# Patient Record
Sex: Female | Born: 1975 | Race: Black or African American | Hispanic: No | Marital: Single | Smoking: Never smoker
Health system: Southern US, Community
[De-identification: ages and names within clinical notes are randomized; demographics above are authoritative.]

## PROBLEM LIST (undated history)

## (undated) DIAGNOSIS — U071 COVID-19: Secondary | ICD-10-CM

## (undated) HISTORY — DX: COVID-19: U07.1

---

## 2000-09-10 ENCOUNTER — Other Ambulatory Visit: Admission: RE | Admit: 2000-09-10 | Discharge: 2000-09-10 | Payer: Self-pay | Admitting: Gynecology

## 2002-02-20 ENCOUNTER — Other Ambulatory Visit: Admission: RE | Admit: 2002-02-20 | Discharge: 2002-02-20 | Payer: Self-pay | Admitting: Gynecology

## 2004-06-06 ENCOUNTER — Other Ambulatory Visit: Admission: RE | Admit: 2004-06-06 | Discharge: 2004-06-06 | Payer: Self-pay | Admitting: Obstetrics and Gynecology

## 2005-03-19 ENCOUNTER — Emergency Department (HOSPITAL_COMMUNITY): Admission: EM | Admit: 2005-03-19 | Discharge: 2005-03-19 | Payer: Self-pay | Admitting: Emergency Medicine

## 2005-09-11 ENCOUNTER — Other Ambulatory Visit: Admission: RE | Admit: 2005-09-11 | Discharge: 2005-09-11 | Payer: Self-pay | Admitting: Obstetrics and Gynecology

## 2007-02-22 ENCOUNTER — Ambulatory Visit (HOSPITAL_COMMUNITY): Admission: RE | Admit: 2007-02-22 | Discharge: 2007-02-22 | Payer: Self-pay | Admitting: Obstetrics and Gynecology

## 2007-02-22 ENCOUNTER — Encounter (INDEPENDENT_AMBULATORY_CARE_PROVIDER_SITE_OTHER): Payer: Self-pay | Admitting: Specialist

## 2007-03-16 ENCOUNTER — Inpatient Hospital Stay (HOSPITAL_COMMUNITY): Admission: AD | Admit: 2007-03-16 | Discharge: 2007-03-16 | Payer: Self-pay | Admitting: Obstetrics and Gynecology

## 2008-03-23 ENCOUNTER — Ambulatory Visit (HOSPITAL_COMMUNITY): Admission: RE | Admit: 2008-03-23 | Discharge: 2008-03-23 | Payer: Self-pay | Admitting: Obstetrics and Gynecology

## 2008-08-13 ENCOUNTER — Inpatient Hospital Stay (HOSPITAL_COMMUNITY): Admission: AD | Admit: 2008-08-13 | Discharge: 2008-08-17 | Payer: Self-pay | Admitting: Obstetrics & Gynecology

## 2011-03-28 NOTE — Op Note (Signed)
Audrey Phillips, Audrey Phillips              ACCOUNT NO.:  [id number]   MEDICAL RECORD NO.:  [id number]          PATIENT TYPE:  INP   LOCATION:  9133                          FACILITY:  [facility]   PHYSICIAN:  Gerrit Friends. Aldona Bar, M.D.   DATE OF BIRTH:  04/20/1976   DATE OF PROCEDURE:  08/14/2008  DATE OF DISCHARGE:                               OPERATIVE REPORT   PREOPERATIVE DIAGNOSES:  1. Term pregnancy.  2. Spontaneous rupture of membranes.  3. Failed induction attempts.  4. Fetal intolerance to contractions.   POSTOPERATIVE DIAGNOSES:  1. Term pregnancy.  2. Spontaneous rupture of membranes.  3. Failed induction attempts.  4. Fetal intolerance to contractions.  5. Delivery of 7 pounds 7 ounces female infant, Apgars 8 and 9.   PROCEDURE:  Primary low-transverse cesarean section.   ANESTHESIA:  Spinal.   HISTORY:  This 35 year old gravida 2, para 0 was admitted on the  afternoon of August 13, 2008, after history consistent with spontaneous  rupture of membranes around 8:00 a.m. on the morning of August 13, 2008.  She was seen in triage and amniotomy was confirmed.  She was positive  for group B strep.  She was admitted, started on intravenous penicillin  and decision was made after discussing the overall situation with the  patient, as it had been done in the office on August 12, 2008, an  induction attempt, although it was very worrisome that the presentation  was unfavorable for a vaginal delivery and that her cervix was closed  and the presenting part, vertex, was essentially ballottable.  The  patient had a previous LEEP procedure on her cervix, which explained  some of the scarring felt on her cervix, but the vertex being  ballottable, it was felt to be possibly related to macrosomia.  Indeed  the patient had an ultrasound in the office on August 12, 2008, and  it revealed that estimated fetal weight was approximately 4000 g with a  normal amniotic fluid volume.   Cytotec was begun  in the early evening of August 13, 2008, and after  about 3 hours, the patient had several contractions and there was a  spontaneous deceleration lasting approximately 3 minutes with a heart  rate dipped into the 60s with good recovery and a reactive tracing  thereafter, but when I examined the patient shortly thereafter, her  cervix essentially was still unchanged and the presentation was  unchanged and it was my feeling that there was going to be in addition  to the possible macrosomia, a developing fetal intolerance to labor, and  decision was made to proceed with primary low-transverse cesarean  section.  The patient was very agreeable with this and was taken to the  operating room accordingly.   Once in the operating room, a spinal anesthetic was placed without  difficulty and thereafter the patient was prepped and draped and placed  in a supine position slightly tilted to the left.  Foley catheter had  been inserted prior to the patient's arrival in the operating room.   Once good anesthetic levels were documented, a Pfannenstiel incision was  made and with minimal difficulty dissected down sharply to and through  the fascia in a low-transverse fashion with hemostasis created at each  layer.  Subfascial space was created inferiorly and superiorly and  muscles separated in the midline.  Peritoneum was identified and  appropriate with care taken to avoid the bowel superiorly and the  bladder inferiorly.  At this time, sharp incision into the lower segment  of the uterus was carried out with Metzenbaum scissors extended  laterally.  There was a surprising amount of amniotic fluid noted at  this time - it was clear.  Thereafter, vertex position a viable female  infant was delivered.  Infant cried spontaneously once and after the  cord was clamped and cut, the infant was passed off to the awaiting team  and ultimately taken to the nursery in good condition.   Subsequent weight was found  to be 7 pounds and 7 ounces and Apgars were  8 and 9.  The patient was a cord blood donor and therefore the placenta  was delivered and passed off and cord bloods will be obtained by the  cord blood team.  At this time, the uterus was exteriorized, rendered  free of any remaining products of conception, and with good uterine  contractility effort and was slowly given intravenous Pitocin and manual  stimulation.  The uterine incision was closed first with a single layer  of #1 Vicryl in a running locking fashion and thereafter several figure-  of-eight #1 Vicryls for additional hemostasis.  At this time, the  incision was dry.  Tubes and ovaries appeared normal.  Abdomen was  lavaged of all free blood and clot.  Uterus replaced into the abdominal  cavity and after all counts were noted to be correct and no foreign  bodies were noted to be remaining in the abdominal cavity, closure of  the abdomen was carried out in layers..  The abdominal peritoneum was  closed with 0 Vicryl in a running fashion and muscle secured with same.  Assured of good fascial hemostasis, the fascia was then reapproximated  using 0 Vicryl from angle to midline bilaterally.  Subcutaneous tissue  was rendered hemostatic and thereafter staples were then used to close  the skin.  A sterile pressure dressing was applied, and the patient at  this time was transported to recovery room in satisfactory condition  having tolerated the procedure well.  Estimated blood loss 500 mL.  All  counts correct x2.   In summary, this patient ultimately required cesarean section because of  fetal intolerance to an attempted induction because of spontaneous  rupture of membranes at term.  There was an unfavorable presentation in  that vertex was ballottable - minus 4.  Her cervix was closed.  She  received one dose Cytotec and although the fetal heart rate was  reactive, had a spontaneous deceleration lasting approximately 3 minutes  with  fetal heart fallen down in the 60s and subsequently recovered again  with reactive tracing.  It was felt that because of the overall  presentation a somewhat probable suspicion for a large baby and now  spontaneous deceleration, very remote from the possibility of vaginal  delivery.  The cesarean section was the best option and accordingly she  was taken to the operating room where she was delivered of a 7 pounds 7  ounces female infant with good Apgar.  At conclusion of procedure, both  mother and baby were doing well in the respective recovery areas.  Gerrit Friends. Aldona Bar, M.D.  Electronically Signed     RMW/MEDQ  D:  08/14/2008  T:  08/14/2008  Job:  [id number]

## 2011-03-31 NOTE — Op Note (Signed)
Audrey Phillips, Audrey Phillips              ACCOUNT NO.:  [id number]   MEDICAL RECORD NO.:  [id number]          PATIENT TYPE:  AMB   LOCATION:  SDC                           FACILITY:  [facility]   PHYSICIAN:  Randye Lobo, M.D.   DATE OF BIRTH:  January 03, 1976   DATE OF PROCEDURE:  02/22/2007  DATE OF DISCHARGE:                               OPERATIVE REPORT   PREOPERATIVE DIAGNOSES:  1. Abnormal Pap smear.  2. 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  3. Unsatisfactory colposcopy.   POSTOPERATIVE DIAGNOSES:  1. Abnormal Pap smear.  2. 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  3. Unsatisfactory colposcopy.   PROCEDURE:  Colposcopy with LEEP conization of the cervix.   SURGEON:  Randye Lobo, M.D.   ANESTHESIA:  LMA, local with 1% lidocaine with 1:100,000 of epinephrine.   ESTIMATED BLOOD LOSS:  Minimal.   URINE OUTPUT:  150 mL.   COMPLICATIONS:  None.   INDICATIONS FOR PROCEDURE:  The patient is a 35 year old gravida 8  African American female with a Pap smear performed December 07, 2006,  which documented ASCUS with a positive high risk HPV status.  The  patient underwent colposcopy on January 17, 2007, which was  unsatisfactory.  No exocervical lesions were appreciated at that time  and an ECC was performed and documented koilocytic atypia of squamous  mucosa.  Endocervical mucosa was noted to be benign.  The colposcopy was  technically difficult due to the patient's anatomy.   A discussion was held with the patient regarding options for care and  due to the unsatisfactory colposcopy, a decision was made to proceed  with a LEEP procedure of the cervix and a possible cold knife cone  biopsy after risks, benefits, and alternatives were reviewed.   FINDINGS:  A colposcopy again was unsatisfactory intraoperatively.  There did appear to be some punctations and possible mosaics scattered  across the exocervix.  The cervix was noted to be nulliparous and the  patient had  long vaginal length.   SPECIMENS:  Three specimens were sent to pathology.  The first specimen  was the exocervical specimen.  The second specimen was the exocervical  specimen which was continued at the 3 o'clock position.  The third  cervical specimen was the endocervix.   PROCEDURE:  The patient was reidentified in the preoperative hold area.  She did receive Ancef 1 gram IV for antibiotic prophylaxis.  In the  operating room, an LMA anesthetic was induced.  The patient was placed  in the dorsal lithotomy position and the vagina and perineum were  sterilely prepped and draped.  The bladder was I&O cathed of urine.  An  exam under anesthesia was performed.  The cervix was palpably normal.  The uterus was noted to be small and no adnexal masses were appreciated.   An insulated speculum was placed inside the vagina and colposcopy was  performed after placing vinegar in the vagina.  The findings are as  noted above.  The cervix was then stained with Lugol's solution.  Local  anesthetic with 1% lidocaine and 1:100,000 epinephrine was  used to  circumferentially inject the cervix.  The LEEP procedure was then  performed with a cutting setting of 60 watts.  The exocervix was removed  in two separate specimens.  The square endocervical cowboy hat was  performed next and all of the specimens were sent separately.  Coagulation was used to coagulate the base of the LEEP conization  without coagulating over the endocervix.  Monsel's was placed over this.  Hemostasis was noted to be satisfactory at this time.  This concluded  the patient's procedure.  There were no complications.  All needle,  instrument and sponge counts were correct.      Randye Lobo, M.D.  Electronically Signed     BES/MEDQ  D:  02/22/2007  T:  02/22/2007  Job:  [id number]

## 2011-03-31 NOTE — Discharge Summary (Signed)
Audrey Phillips, Audrey Phillips              ACCOUNT NO.:  [id number]   MEDICAL RECORD NO.:  [id number]          PATIENT TYPE:  INP   LOCATION:  9133                          [facility]ILITY:  [facility]   PHYSICIAN:  Malva Limes, M.D.    DATE OF BIRTH:  12/18/1975   DATE OF ADMISSION:  08/13/2008  DATE OF DISCHARGE:  08/17/2008                               DISCHARGE SUMMARY   FINAL DIAGNOSES:  Intrauterine pregnancy at term, spontaneous rupture of  membranes.  Failed induction attempt and fetal intolerance to  contractions.   PROCEDURE:  Primary low transverse cesarean section.   SURGEON:  Gerrit Friends. Aldona Bar, M.D.   COMPLICATIONS:  None.   HOSPITAL COURSE:  This 35 year old G2, P 0-0-1-0 presents at term with  spontaneous rupture of membranes.  The patient is known to be positive  for group B strep, and was started on IV penicillin.  The patient's  antepartum course up to this point had been complicated by history of a  LEEP procedure and her known group B strep status.  Otherwise,  antepartum course was without complications.  During the patient's  labor, she was started on Pitocin.  The patient's cervix was still  closed and unfavorable, but she had also had a history of a LEEP  procedure, and some of this was thought to be from scarring on her  cervix, so Cytotec was begun on the morning of August 13, 2008.  There  were some spontaneous decelerations lasting about 3 minutes, with good  recovery, but after the patient was examined and her cervix was still  unchanged, a decision was made to proceed with a cesarean section  secondary to being remote from delivery with fetal intolerance to labor.  At this point, the patient was taken to the operating room on August 14, 2008, by Dr. Annamaria Helling, where a primary low transverse cesarean  section was performed, with the delivery of 7-pound 7-ounce female infant  with Apgars of 8 and 9.  The delivery went without complications.  The  patient's postoperative  course was benign without any significant  fevers.  She did want her little boy circumcised prior to discharge, and  she was felt ready for discharge on postoperative day #3.  She was sent  home on a regular diet, told to decrease activities, told to continue  her prenatal vitamins daily, was given Percocet 1-2 every 4-6 hours as  needed for pain, told she could use ibuprofen up to 600 mg every 6 hours  as needed for pain, and was to follow up in our office in 4 weeks.  Instructions and precautions were reviewed with the patient.   LABORATORIES ON DISCHARGE:  The patient had a hemoglobin of 12.1, white  blood cell count of 14.2, and platelets of 286,000.     Leilani Able, P.A.-C.      ______________________________  Malva Limes, M.D.   MB/MEDQ  D:  09/15/2008  T:  09/16/2008  Job:  [id number]

## 2011-05-04 ENCOUNTER — Other Ambulatory Visit: Payer: Self-pay | Admitting: Obstetrics and Gynecology

## 2011-08-14 LAB — CBC
HCT: 35.7 — ABNORMAL LOW
MCHC: 33.1
Platelets: 286
Platelets: 386
RBC: 4.63
WBC: 13.8 — ABNORMAL HIGH
WBC: 14.2 — ABNORMAL HIGH

## 2011-08-14 LAB — RPR: RPR Ser Ql: NONREACTIVE

## 2012-03-28 ENCOUNTER — Telehealth: Payer: Self-pay | Admitting: Oncology

## 2012-03-28 NOTE — Telephone Encounter (Signed)
Pt called back to confirm her appt d/t for 04/10/2012

## 2012-03-28 NOTE — Telephone Encounter (Signed)
lmonvm advising the pt of her new pt appt with dr Clelia Croft on 04/10/2012 and to call me back to confirm

## 2012-03-28 NOTE — Telephone Encounter (Signed)
Del.03/28/12 Ref.Dr.Candace Smith Dx.Elevated WBC

## 2012-04-05 ENCOUNTER — Other Ambulatory Visit: Payer: Self-pay | Admitting: Oncology

## 2012-04-05 DIAGNOSIS — D72829 Elevated white blood cell count, unspecified: Secondary | ICD-10-CM

## 2012-04-10 ENCOUNTER — Ambulatory Visit: Payer: Self-pay

## 2012-04-10 ENCOUNTER — Ambulatory Visit (HOSPITAL_BASED_OUTPATIENT_CLINIC_OR_DEPARTMENT_OTHER): Payer: 59 | Admitting: Oncology

## 2012-04-10 ENCOUNTER — Encounter: Payer: Self-pay | Admitting: Oncology

## 2012-04-10 ENCOUNTER — Telehealth: Payer: Self-pay | Admitting: Oncology

## 2012-04-10 ENCOUNTER — Other Ambulatory Visit (HOSPITAL_BASED_OUTPATIENT_CLINIC_OR_DEPARTMENT_OTHER): Payer: 59 | Admitting: Lab

## 2012-04-10 VITALS — BP 128/85 | HR 66 | Temp 98.7°F | Ht 60.5 in | Wt 235.9 lb

## 2012-04-10 DIAGNOSIS — D72829 Elevated white blood cell count, unspecified: Secondary | ICD-10-CM

## 2012-04-10 LAB — COMPREHENSIVE METABOLIC PANEL
Albumin: 4.2 g/dL (ref 3.5–5.2)
BUN: 10 mg/dL (ref 6–23)
CO2: 25 mEq/L (ref 19–32)
Calcium: 8.9 mg/dL (ref 8.4–10.5)
Chloride: 105 mEq/L (ref 96–112)
Glucose, Bld: 102 mg/dL — ABNORMAL HIGH (ref 70–99)
Potassium: 4.3 mEq/L (ref 3.5–5.3)
Sodium: 138 mEq/L (ref 135–145)
Total Protein: 6.7 g/dL (ref 6.0–8.3)

## 2012-04-10 LAB — CBC WITH DIFFERENTIAL/PLATELET
Basophils Absolute: 0.1 10*3/uL (ref 0.0–0.1)
Eosinophils Absolute: 0.4 10*3/uL (ref 0.0–0.5)
HCT: 40.6 % (ref 34.8–46.6)
HGB: 13.1 g/dL (ref 11.6–15.9)
MCH: 27.2 pg (ref 25.1–34.0)
MONO#: 1 10*3/uL — ABNORMAL HIGH (ref 0.1–0.9)
NEUT#: 8.8 10*3/uL — ABNORMAL HIGH (ref 1.5–6.5)
NEUT%: 66.2 % (ref 38.4–76.8)
lymph#: 3.1 10*3/uL (ref 0.9–3.3)

## 2012-04-10 NOTE — Progress Notes (Signed)
New patient today, patient has insurance, patient did not need financial assistance at this time, gave patient contact information.

## 2012-04-10 NOTE — Progress Notes (Signed)
Note dictated

## 2012-04-10 NOTE — Telephone Encounter (Signed)
gv pt appt schedule for nov.  °

## 2012-04-10 NOTE — Progress Notes (Signed)
CC:   Dario Guardian, M.D.  REASON FOR CONSULTATION:  Leukocytosis.  HISTORY OF PRESENT ILLNESS:  This is a pleasant 36 year old [place] American woman, native of [place], lived the majority of her life around this area.  She is a very healthy 36 year old with a history of mild obesity but otherwise has not really had any major issues.  She has had a history of seasonal allergies as well as recurrent sinus problems in the past, but for the most part, has not really had any hospitalization or chronic illnesses.  She was evaluated recently by her primary care physician in April of 2013 and her CBC at that time showed her white cell count was 12.7, hemoglobin was 13, platelet count was around 340.  Previous CBCs were reviewed and she had a normal CBC in March of 2012 of 10.5, normal differential at that time.  She had a white cell count 13,000 in February of 2011.  In 2009 she had had an elevated white cell count as well of 14,000 and 13,000 respectively in October of 2009.  For that reason, the patient was referred to me for evaluation.  Clinically she feels well.  She has not had any recent illnesses.  She has not had any recent hospitalizations.  She has had some recurrent sinus problems and drainage and seasonal allergies but has not had any sinusitis, has not any bronchitis or pneumonia.  She has not had any skin infections or hospitalization.  She is under psychological stress being a single mother, working, but for the most part, really no illnesses or hospitalizations.  REVIEW OF SYSTEMS:  She did not report any headaches, blurry vision, double vision.  She did not report any motor or sensory neuropathy.  She did not report any alteration in mental status.  She did not report any psychiatric issues, depression.  She did not report any fever, chills, sweats.  She did not report any cough, hemoptysis, hematemesis.  No nausea or vomiting.  No abdominal pain, hematochezia,  melena, genitourinary complaints.  Rest of review of system is unremarkable.  PAST MEDICAL HISTORY:  Really unremarkable for any history of hypertension, diabetes, coronary artery disease.  Status post LEEP procedure and C-section 2008 and 2009.  She has had recurrent sinus problems, as mentioned, allergies and bronchitis as a child.  FAMILY HISTORY:  Father alive.  She does not really know much about his history.  Mother died of cirrhosis of the liver.  Her paternal grandmother had a brain tumor.  Paternal grandmother had cancer of the breast.  Sister has high blood pressure.  SOCIAL HISTORY:  She is single, has 1 child.  Denied any alcohol or tobacco abuse.  She works in Clinical biochemist.  ALLERGIES:  Fish oil.  PHYSICAL EXAM:  General:  Alert, awake female, appeared in no active distress.  Vital signs:  Blood pressure 128/85, pulse 66, respiration 20, temperature is 98.  Weight is 235 pounds.  HEENT:  Head is normocephalic, atraumatic.  Pupils equal, round, reactive to light. Oral mucosa moist and pink.  Neck:  Supple without lymphadenopathy. Heart:  Regular rate and rhythm.  S1, S2.  Lungs:  Clear auscultation without rhonchi, wheeze, dullness to percussion.  Abdomen:  Soft, nontender.  No hepatosplenomegaly.  Extremities:  No clubbing, cyanosis, or edema.  Neurological:  Intact motor, sensory and deep tendon reflexes.  LABORATORY DATA:  Showed a hemoglobin of 13, white cell count was 13,000, platelet count 338, neutrophil percentage 66.2, lymphocyte percentage 23.0, all  normal.  Peripheral smear was personally reviewed today.  Could not really appreciate any evidence of any schistocytosis or red cell fragmentation.  I could not really appreciate any white cell abnormalities.  There is no evidence of any leukoblasts or an abnormal or poorly differentiated cells or immature cells.  ASSESSMENT AND PLAN:  This is a pleasant 36 year old woman with the following  issues:  Leukocytosis:  Differential diagnosis discussed today with Ms. Audrey Phillips. I feel that this is most likely a reactive process at this point. Conditions like allergy, infection, stress all cause mild leukocytosis. It seems to be a chronic problem, fluctuating problem for the last 3-4 years.  I doubt this is a sign of a myeloproliferative disorder. Conditions such as chronic myelogenous leukemia and other myeloproliferative disorders, I think, are unlikely at this point.  The peripheral smear does not suggest that.  Her history does not suggest at this time.  In terms of moving forward and for management, I would probably recommend observation.  I would like to repeat her blood counts in about 6 months and look at her smear at this time.  I will defer further workup including BCR/ABL, JAK2 mutation or bone marrow biopsy if there is a persistent neutrophilia at that time.  All her questions were answered today.    ______________________________ Benjiman Core, M.D. FNS/MEDQ  D:  04/10/2012  T:  04/10/2012  Job:  [id number]

## 2012-07-31 ENCOUNTER — Telehealth: Payer: Self-pay | Admitting: Oncology

## 2012-07-31 NOTE — Telephone Encounter (Signed)
LVM for pt regarding appt time change, mailed appt change form.   sed

## 2012-10-11 ENCOUNTER — Ambulatory Visit: Payer: 59 | Admitting: Oncology

## 2012-10-11 ENCOUNTER — Other Ambulatory Visit: Payer: 59 | Admitting: Lab

## 2016-02-09 ENCOUNTER — Other Ambulatory Visit: Payer: Self-pay | Admitting: Radiology

## 2018-01-22 ENCOUNTER — Other Ambulatory Visit (HOSPITAL_COMMUNITY)
Admission: RE | Admit: 2018-01-22 | Discharge: 2018-01-22 | Disposition: A | Discharge status: Home / Self Care | Payer: Medicaid Other | Source: Ambulatory Visit | Attending: Physician Assistant | Admitting: Physician Assistant

## 2018-01-22 ENCOUNTER — Other Ambulatory Visit: Payer: Self-pay | Admitting: Physician Assistant

## 2018-01-22 DIAGNOSIS — Z0000 Encounter for general adult medical examination without abnormal findings: Secondary | ICD-10-CM | POA: Insufficient documentation

## 2018-01-25 LAB — CYTOLOGY - PAP
ADEQUACY: ABSENT — AB
Diagnosis: UNDETERMINED — AB
HPV: DETECTED — AB

## 2018-02-13 ENCOUNTER — Other Ambulatory Visit: Payer: Self-pay | Admitting: Obstetrics and Gynecology

## 2019-02-11 ENCOUNTER — Emergency Department (HOSPITAL_COMMUNITY): Payer: 59

## 2019-02-11 ENCOUNTER — Emergency Department (HOSPITAL_COMMUNITY)
Admission: AD | Admit: 2019-02-11 | Discharge: 2019-02-11 | Disposition: A | Discharge status: Home / Self Care | Payer: 59 | Attending: Emergency Medicine | Admitting: Emergency Medicine

## 2019-02-11 ENCOUNTER — Telehealth: Payer: Self-pay | Admitting: *Deleted

## 2019-02-11 ENCOUNTER — Other Ambulatory Visit: Payer: Self-pay

## 2019-02-11 DIAGNOSIS — R102 Pelvic and perineal pain: Secondary | ICD-10-CM | POA: Diagnosis present

## 2019-02-11 DIAGNOSIS — Z79899 Other long term (current) drug therapy: Secondary | ICD-10-CM | POA: Insufficient documentation

## 2019-02-11 DIAGNOSIS — N926 Irregular menstruation, unspecified: Secondary | ICD-10-CM | POA: Insufficient documentation

## 2019-02-11 DIAGNOSIS — N946 Dysmenorrhea, unspecified: Secondary | ICD-10-CM

## 2019-02-11 DIAGNOSIS — N857 Hematometra: Secondary | ICD-10-CM

## 2019-02-11 LAB — URINALYSIS, ROUTINE W REFLEX MICROSCOPIC
Bilirubin Urine: NEGATIVE
Glucose, UA: NEGATIVE mg/dL
Hgb urine dipstick: NEGATIVE
Ketones, ur: NEGATIVE mg/dL
Leukocytes,Ua: NEGATIVE
Nitrite: NEGATIVE
Protein, ur: NEGATIVE mg/dL
Specific Gravity, Urine: 1.02 (ref 1.005–1.030)
pH: 7 (ref 5.0–8.0)

## 2019-02-11 LAB — WET PREP, GENITAL
Clue Cells Wet Prep HPF POC: NONE SEEN
Sperm: NONE SEEN
Trich, Wet Prep: NONE SEEN
WBC, Wet Prep HPF POC: NONE SEEN
Yeast Wet Prep HPF POC: NONE SEEN

## 2019-02-11 LAB — COMPREHENSIVE METABOLIC PANEL WITH GFR
ALT: 13 U/L (ref 0–44)
AST: 24 U/L (ref 15–41)
Albumin: 3.9 g/dL (ref 3.5–5.0)
Alkaline Phosphatase: 70 U/L (ref 38–126)
Anion gap: 12 (ref 5–15)
BUN: 10 mg/dL (ref 6–20)
CO2: 18 mmol/L — ABNORMAL LOW (ref 22–32)
Calcium: 8.8 mg/dL — ABNORMAL LOW (ref 8.9–10.3)
Chloride: 105 mmol/L (ref 98–111)
Creatinine, Ser: 0.79 mg/dL (ref 0.44–1.00)
GFR calc Af Amer: >60 mL/min
GFR calc non Af Amer: >60 mL/min
Glucose, Bld: 172 mg/dL — ABNORMAL HIGH (ref 70–99)
Potassium: 3.5 mmol/L (ref 3.5–5.1)
Sodium: 135 mmol/L (ref 135–145)
Total Bilirubin: 0.5 mg/dL (ref 0.3–1.2)
Total Protein: 7.6 g/dL (ref 6.5–8.1)

## 2019-02-11 LAB — CBC
HCT: 41.3 % (ref 36.0–46.0)
Hemoglobin: 13.3 g/dL (ref 12.0–15.0)
MCH: 27.2 pg (ref 26.0–34.0)
MCHC: 32.2 g/dL (ref 30.0–36.0)
MCV: 84.5 fL (ref 80.0–100.0)
PLATELETS: 347 10*3/uL (ref 150–400)
RBC: 4.89 MIL/uL (ref 3.87–5.11)
RDW: 13.6 % (ref 11.5–15.5)
WBC: 16.9 10*3/uL — ABNORMAL HIGH (ref 4.0–10.5)
nRBC: 0 % (ref 0.0–0.2)

## 2019-02-11 LAB — GC/CHLAMYDIA PROBE AMP (~~LOC~~) NOT AT ARMC
Chlamydia: NEGATIVE
Neisseria Gonorrhea: NEGATIVE

## 2019-02-11 LAB — POCT PREGNANCY, URINE: Preg Test, Ur: NEGATIVE

## 2019-02-11 LAB — LIPASE, BLOOD: Lipase: 19 U/L (ref 11–51)

## 2019-02-11 MED ORDER — MORPHINE SULFATE (PF) 4 MG/ML IV SOLN
4.0000 mg | Freq: Once | INTRAVENOUS | Status: AC
  Administered 2019-02-11: 4 mg via INTRAVENOUS
  Filled 2019-02-11: qty 1

## 2019-02-11 MED ORDER — ONDANSETRON HCL 4 MG/2ML IJ SOLN
4.0000 mg | Freq: Once | INTRAMUSCULAR | Status: AC | PRN
  Administered 2019-02-11: 4 mg via INTRAVENOUS
  Filled 2019-02-11: qty 2

## 2019-02-11 MED ORDER — ONDANSETRON 4 MG PO TBDP: 4.0000 mg | ORAL_TABLET | Freq: Four times a day (QID) | ORAL | 0 refills | Status: AC | PRN

## 2019-02-11 MED ORDER — IBUPROFEN 800 MG PO TABS: 800.0000 mg | ORAL_TABLET | Freq: Three times a day (TID) | ORAL | 0 refills | Status: AC | PRN

## 2019-02-11 MED ORDER — KETOROLAC TROMETHAMINE 30 MG/ML IJ SOLN
30.0000 mg | Freq: Once | INTRAMUSCULAR | Status: AC
  Administered 2019-02-11: 30 mg via INTRAVENOUS
  Filled 2019-02-11: qty 1

## 2019-02-11 MED ORDER — ONDANSETRON HCL 4 MG/2ML IJ SOLN
4.0000 mg | Freq: Once | INTRAMUSCULAR | Status: DC
Start: 2019-02-11 — End: 2019-02-11

## 2019-02-11 MED ORDER — ONDANSETRON HCL 4 MG/2ML IJ SOLN
INTRAMUSCULAR | Status: AC
  Filled 2019-02-11: qty 2

## 2019-02-11 MED ORDER — SODIUM CHLORIDE 0.9% FLUSH
3.0000 mL | Freq: Once | INTRAVENOUS | Status: AC
  Administered 2019-02-11: 3 mL via INTRAVENOUS

## 2019-02-11 MED ORDER — OXYCODONE-ACETAMINOPHEN 5-325 MG PO TABS: 2.0000 | ORAL_TABLET | Freq: Four times a day (QID) | ORAL | 0 refills | Status: AC | PRN

## 2019-02-11 NOTE — Discharge Instructions (Signed)
Please call your OB/GYN this morning for close outpatient follow-up.  Your ultrasound showed a fluid collection in the lower part of your uterus called a hematometra that will need to be addressed soon.  We have talked to the on-call OB/GYN.  Given your pain is well controlled, we feel it is reasonable to treat you as an outpatient.  If you have increasing pain, vomiting that will not stop, fever, begin bleeding so heavily that you are soaking through more than a pad an hour for more than 2 straight hours, feel lightheaded or pass out, please return to the emergency department immediately.

## 2019-02-11 NOTE — Telephone Encounter (Signed)
Pt called stating she gave incorrect pharmacy to have Rx e-scribed to.  EDCM reviewed chart to find pt has narcotic prescribed.  [facility] informed pt that we can not call this Rx in.  PT stated pharmacy it went to is just a little out of the way, but she will go there.  No further EDCM needs identified.

## 2019-02-11 NOTE — MAU Note (Addendum)
Pt here for period cramping and pain that started this morning and vomiting. States she cannot keep anything down. Pt states this has been an ongoing issue x1 year. Last took 400mg  of ibuprofen around 8-9pm, but vomited it up. Reports that she is not pregnant. LMP: 02/07/2019.

## 2019-02-11 NOTE — ED Triage Notes (Signed)
Pt to ED for pelvic pain with N&V.  10 of 10 cramping pain.

## 2019-02-11 NOTE — ED Provider Notes (Signed)
TIME SEEN: 2:57 AM  CHIEF COMPLAINT: Pelvic pain, vomiting  HPI: Patient is a 43 year old female with history of dysmenorrhea who presents to the emergency department with complaints of pelvic pain for the past couple of days and nausea and vomiting.  Also has had some diarrhea.  Reports she has had similar episodes intermittently for the past year and was put on birth control by her OB/GYN, Gerald Leitz.  States that she has had mucus-like vaginal discharge and some dysuria.  No fevers, chest pain, shortness of breath, cough.  No previous abdominal surgery.  Has had history of sexually transmitted disease as a teenager.  Has had 2 pregnancies with 1 living child and one on elective abortion.   ROS: See HPI Constitutional: no fever  Eyes: no drainage  ENT: no runny nose   Cardiovascular:  no chest pain  Resp: no SOB  GI: Vomiting and diarrhea GU: no dysuria Integumentary: no rash  Allergy: no hives  Musculoskeletal: no leg swelling  Neurological: no slurred speech ROS otherwise negative  PAST MEDICAL HISTORY/PAST SURGICAL HISTORY:  No past medical history on file.  MEDICATIONS:  Prior to Admission medications   Medication Sig Start Date End Date Taking? Authorizing Provider  Multiple Vitamin (MULTIVITAMIN) tablet Take 1 tablet by mouth daily.    [provider]    ALLERGIES:  No Known Allergies  SOCIAL HISTORY:  Social History   Tobacco Use  . Smoking status: Not on file  Substance Use Topics  . Alcohol use: Not on file    FAMILY HISTORY: No family history on file.  EXAM: BP (!) 145/94 (BP Location: Right Arm)   Pulse 67   Temp 97.7 F (36.5 C) (Oral)   Resp 20   Ht 5\' 1"  (1.549 m)   Wt 107.8 kg   LMP 02/07/2019   SpO2 99%   BMI 44.91 kg/m  CONSTITUTIONAL: Alert and oriented and responds appropriately to questions.  Obese, appears very uncomfortable, actively vomiting HEAD: Normocephalic EYES: Conjunctivae clear, pupils appear equal, EOMI ENT: normal  nose; moist mucous membranes NECK: Supple, no meningismus, no nuchal rigidity, no LAD  CARD: RRR; S1 and S2 appreciated; no murmurs, no clicks, no rubs, no gallops RESP: Normal chest excursion without splinting or tachypnea; breath sounds clear and equal bilaterally; no wheezes, no rhonchi, no rales, no hypoxia or respiratory distress, speaking full sentences ABD/GI: Normal bowel sounds; non-distended; soft, dry throughout the pelvic region but more so on the left side, no significant tenderness at McBurney's point, no rebound, no guarding, no peritoneal signs, no hepatosplenomegaly GU:  Normal external genitalia. No lesions, rashes noted.  Patient has a small amount of dark red/brown blood coming from the cervical loss with mucus-like discharge.  She has left adnexal tenderness without mass or fullness.  No significant right adnexal tenderness.  No cervical motion tenderness.  Cervix is not appear friable.  Cervix is closed.  Chaperone present for exam.  BACK:  The back appears normal and is non-tender to palpation, there is no CVA tenderness EXT: Normal ROM in all joints; non-tender to palpation; no edema; normal capillary refill; no cyanosis, no calf tenderness or swelling    SKIN: Normal color for age and race; warm; no rash NEURO: Moves all extremities equally PSYCH: The patient's mood and manner are appropriate. Grooming and personal hygiene are appropriate.  MEDICAL DECISION MAKING: Patient here with pelvic pain and vomiting.  Has had symptoms of menometrorrhagia before.  States that she was started on birth control  and had not had a period since December.  She denies being sexually active.  Pelvic cultures have been sent.  She initially presented to [facility] and had a negative urinalysis and negative pregnancy test and was sent here for further evaluation.  Differential includes endometriosis, fibroids, ovarian torsion, ovarian cyst, TOA or less likely PID.  Doubt appendicitis.  Will obtain  transvaginal ultrasound with Doppler.  Will obtain labs.  Will give pain and nausea medicine.  ED PROGRESS: Labs show leukocytosis of 16,000 which appears to be near patient's baseline.  Hemoglobin 13 which is stable.  Otherwise labs unremarkable.  Wet prep unremarkable as well.   6:00 AM  D/w Dr. Su Hilt on call with patient's OB/GYN.  Patient's transvaginal ultrasound did not visualize either ovary but did show complex fluid collection in the lower uterine segment concerning for a hematometra.  She has not had any recent ablation or procedure.  Has previously had a LEEP.  Did have a biopsy 1 year ago.  Dr. Su Hilt recommends close follow-up with her OB/GYN as she will likely need her cervix dilated to help remove this fluid collection and recommend she call in the morning.  She will also get in touch with patient's OB/GYN.  Patient's pain is been well controlled here and she feels comfortable with the plan for discharge home.  We had lengthy discussion of return precautions.    At this time, I do not feel there is any life-threatening condition present. I have reviewed and discussed all results (EKG, imaging, lab, urine as appropriate) and exam findings with patient/family. I have reviewed nursing notes and appropriate previous records.  I feel the patient is safe to be discharged home without further emergent workup and can continue workup as an outpatient as needed. Discussed usual and customary return precautions. Patient/family verbalize understanding and are comfortable with this plan.  Outpatient follow-up has been provided as needed. All questions have been answered.    Skyler Carel, Layla Maw, DO 02/11/19 [phone]

## 2019-02-11 NOTE — [facility]U Provider Note (Signed)
  S Ms. Audrey Phillips is a 43 y.o.  non-pregnant female who presents to [facility]U today with complaint of cramping.  She states her pain started around 9pm and has been ongoing.    O BP (!) 145/94 (BP Location: Right Arm)   Pulse 67   Temp 97.7 F (36.5 C) (Oral)   Resp 20   Ht 5\' 1"  (1.549 m)   Wt 107.8 kg   LMP 02/07/2019   SpO2 99%   BMI 44.91 kg/m  Physical Exam  Constitutional: She is oriented to person, place, and time. She appears well-developed and well-nourished.  HENT:  Head: Normocephalic and atraumatic.  Eyes: Conjunctivae are normal.  Neck: Normal range of motion.  Cardiovascular: Normal rate.  Respiratory: Effort normal.  Neurological: She is alert and oriented to person, place, and time.  Psychiatric: She has a normal mood and affect. Her behavior is normal.    A Non pregnant female Medical screening exam complete   P Discharge from [facility]U in stable condition Patient given the option of transfer to [facility] for further evaluation or seek care in outpatient facility of choice Patient opts for transfer to [facility] Report called to [facility] by charge RN  Gerrit Heck, CNM 02/11/2019

## 2019-02-12 ENCOUNTER — Other Ambulatory Visit: Payer: Self-pay | Admitting: Obstetrics and Gynecology

## 2019-02-12 ENCOUNTER — Other Ambulatory Visit (HOSPITAL_COMMUNITY)
Admission: RE | Admit: 2019-02-12 | Discharge: 2019-02-12 | Disposition: A | Discharge status: Home / Self Care | Payer: 59 | Source: Ambulatory Visit | Attending: Obstetrics and Gynecology | Admitting: Obstetrics and Gynecology

## 2019-02-12 DIAGNOSIS — R87610 Atypical squamous cells of undetermined significance on cytologic smear of cervix (ASC-US): Secondary | ICD-10-CM | POA: Diagnosis present

## 2019-02-17 LAB — CYTOLOGY - PAP: HPV: DETECTED — AB

## 2019-07-03 ENCOUNTER — Other Ambulatory Visit: Payer: Self-pay | Admitting: Obstetrics and Gynecology

## 2019-11-17 IMAGING — US US PELVIS LIMITED
1 series · 14 of 25 positions shown · non-contrast
Comparison: None similar

CLINICAL DATA: Left adnexal pain



[Series 1: us pelvis limited · 43 acquisitions, 14 frames shown]
[im 1/43]
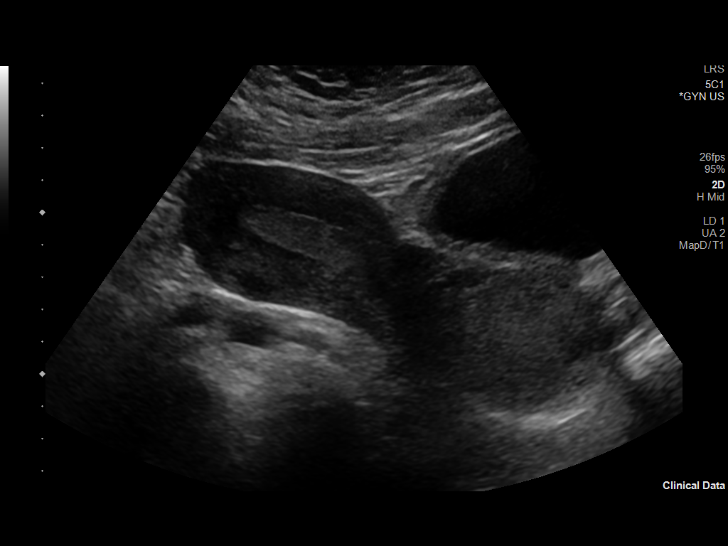
[im 4/43]
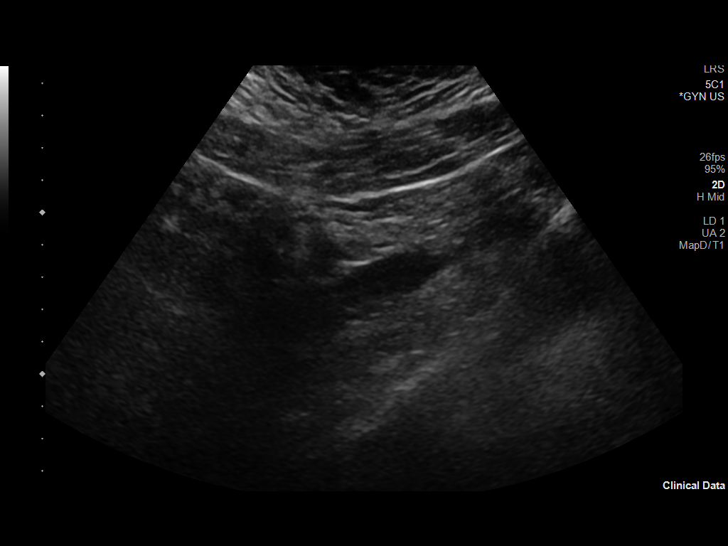
[im 8/43]
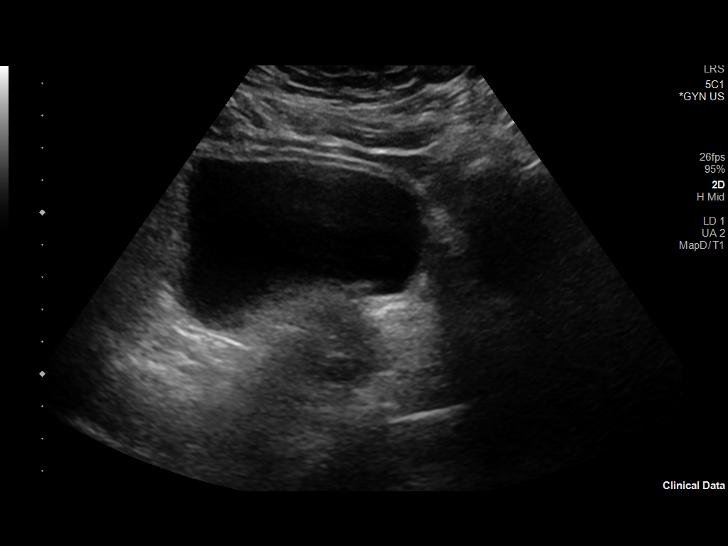
[im 11/43]
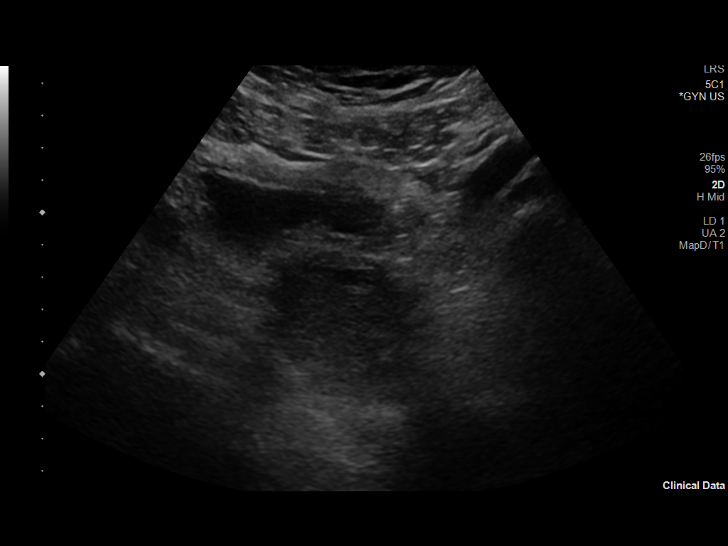
[im 15/43]
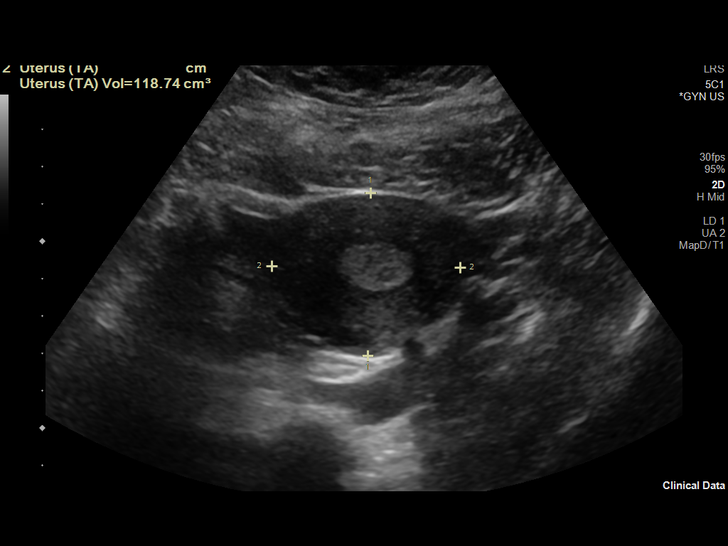
[im 16/43]
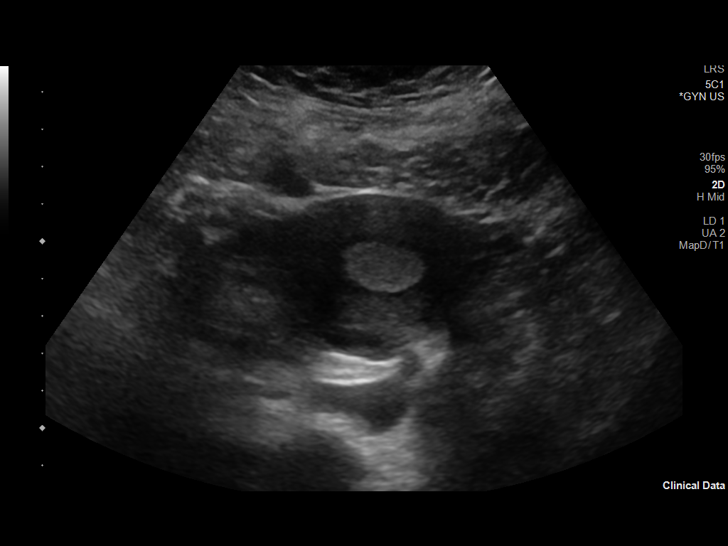
[im 20/43]
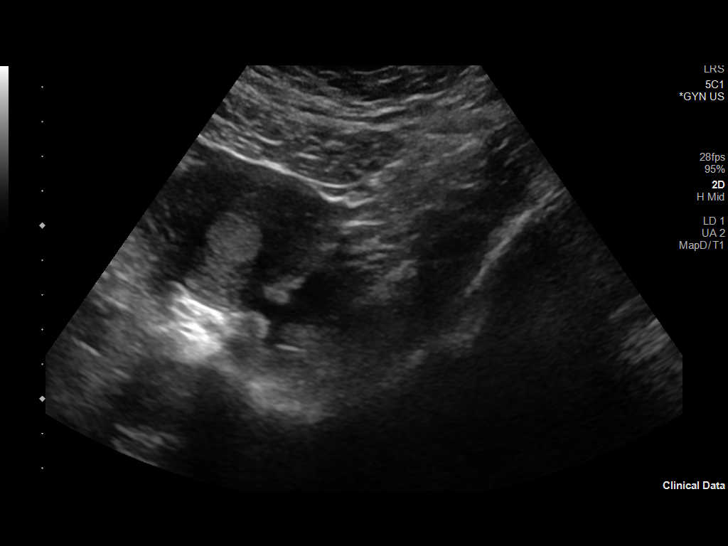
[im 23/43]
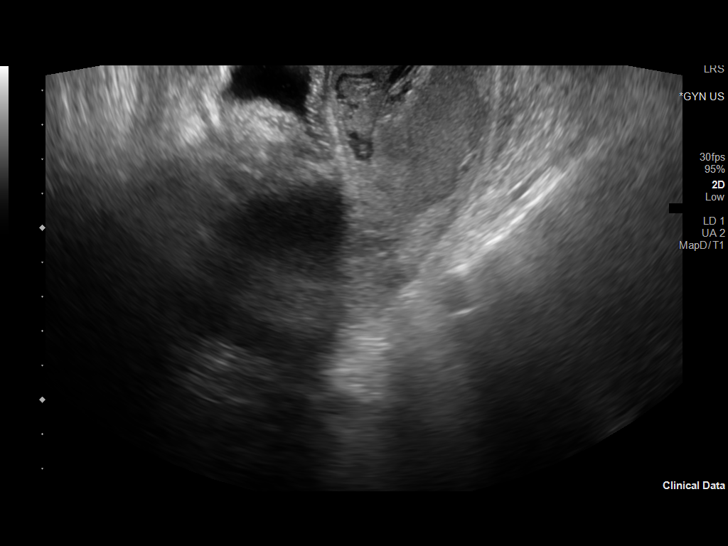
[im 27/43]
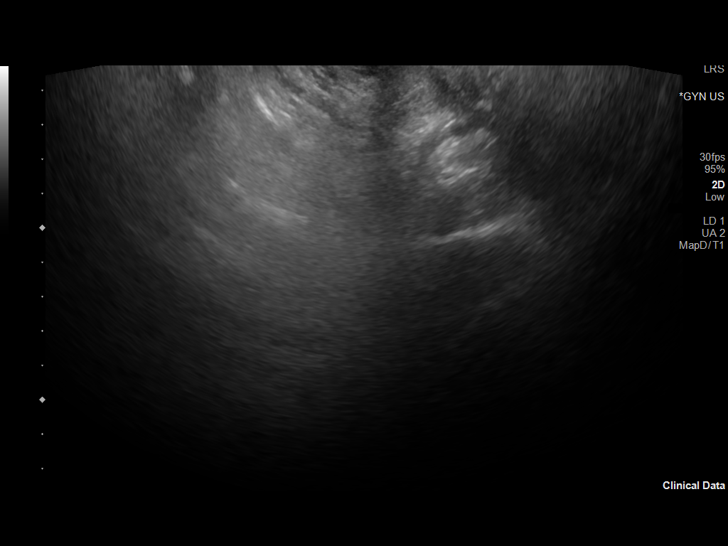
[im 29/43]
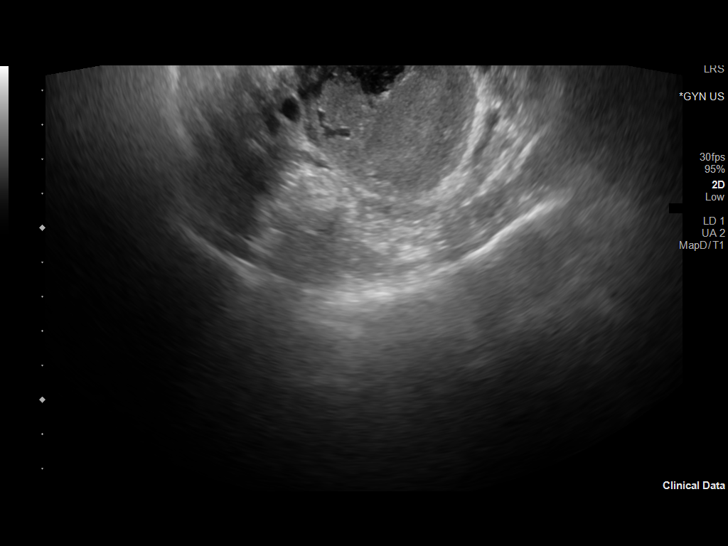
[im 32/43]
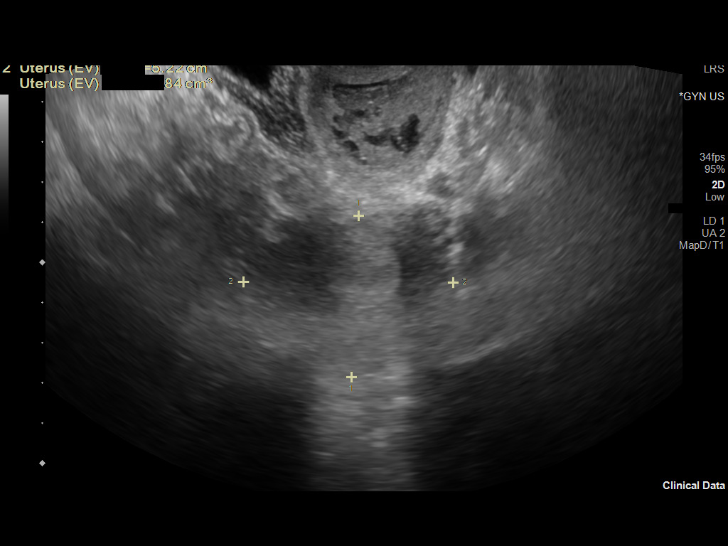
[im 36/43]
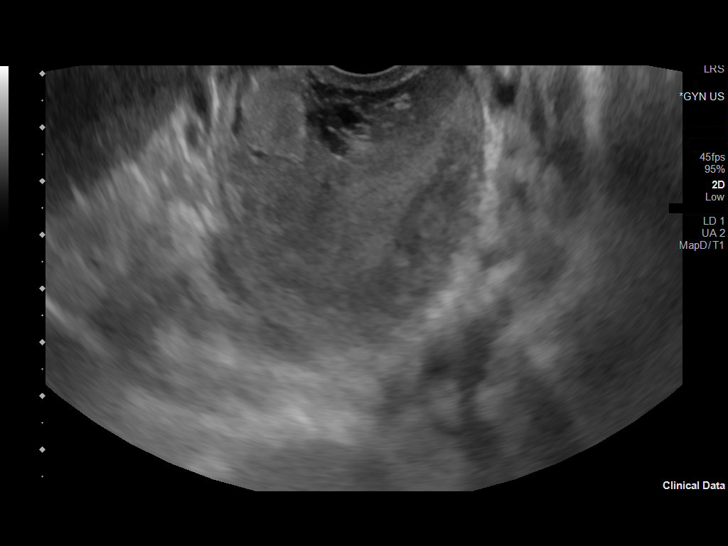
[im 39/43]
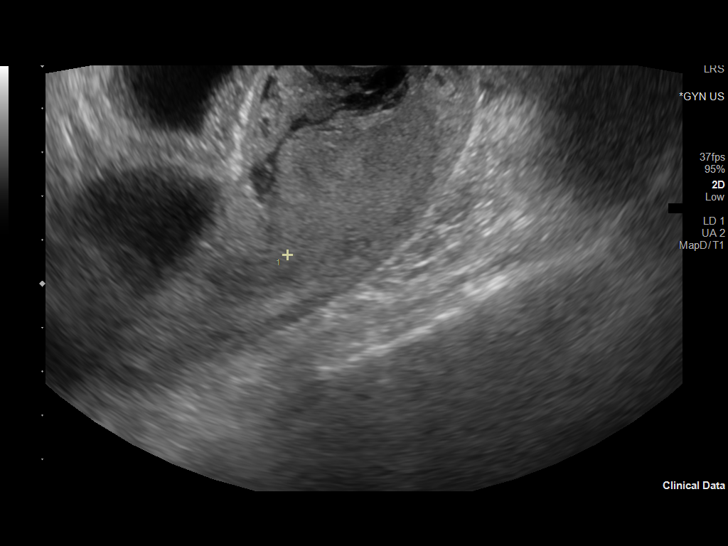
[im 43/43]
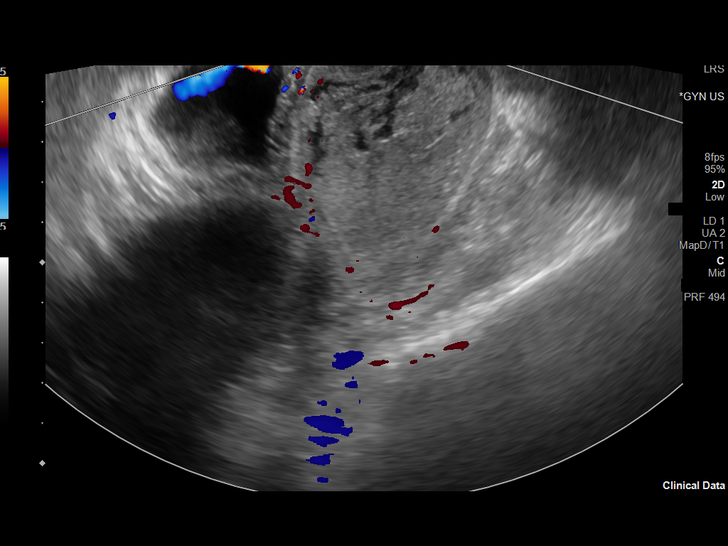

[14 of 25 positions shown; findings below may reference images not displayed]

FINDINGS: Uterus

Measurements: 10 x 4 x 5 cm = volume: 120 mL. At the lower uterine
segment/cervix there is expansion with heterogeneous internal
architecture that shows mobility on clips. No adjacent
hypervascularity or reported tenderness to imply an infectious
collection.

Endometrium

Thickness: 12 mm.

Right ovary

Not visualized

Left ovary

Not visualized

Other findings

No abnormal free fluid.
IMPRESSION: 1. Expanded cervix/lower uterine segment by complex fluid, favor
hematometra. Recommend gynecology referral.
2. The ovaries could not be visualized.

## 2019-11-26 ENCOUNTER — Other Ambulatory Visit: Payer: Self-pay

## 2019-11-26 ENCOUNTER — Emergency Department (HOSPITAL_COMMUNITY)
Admission: EM | Admit: 2019-11-26 | Discharge: 2019-11-27 | Disposition: A | Discharge status: Home / Self Care | Payer: No Typology Code available for payment source | Attending: Emergency Medicine | Admitting: Emergency Medicine

## 2019-11-26 DIAGNOSIS — R0789 Other chest pain: Secondary | ICD-10-CM | POA: Insufficient documentation

## 2019-11-26 DIAGNOSIS — R079 Chest pain, unspecified: Secondary | ICD-10-CM

## 2019-11-26 LAB — BASIC METABOLIC PANEL
Anion gap: 10 (ref 5–15)
BUN: 13 mg/dL (ref 6–20)
CO2: 27 mmol/L (ref 22–32)
Calcium: 9.3 mg/dL (ref 8.9–10.3)
Chloride: 101 mmol/L (ref 98–111)
Creatinine, Ser: 0.84 mg/dL (ref 0.44–1.00)
GFR calc Af Amer: >60 mL/min (ref >60)
GFR calc non Af Amer: >60 mL/min (ref >60)
Glucose, Bld: 99 mg/dL (ref 70–99)
Potassium: 4.3 mmol/L (ref 3.5–5.1)
Sodium: 138 mmol/L (ref 135–145)

## 2019-11-26 LAB — CBC
HCT: 45.6 % (ref 36.0–46.0)
Hemoglobin: 15 g/dL (ref 12.0–15.0)
MCH: 28.6 pg (ref 26.0–34.0)
MCHC: 32.9 g/dL (ref 30.0–36.0)
MCV: 86.9 fL (ref 80.0–100.0)
Platelets: 392 10*3/uL (ref 150–400)
RBC: 5.25 MIL/uL — ABNORMAL HIGH (ref 3.87–5.11)
RDW: 14.1 % (ref 11.5–15.5)
WBC: 13.2 10*3/uL — ABNORMAL HIGH (ref 4.0–10.5)
nRBC: 0 % (ref 0.0–0.2)

## 2019-11-26 LAB — I-STAT BETA HCG BLOOD, ED (MC, WL, AP ONLY): I-stat hCG, quantitative: <5 m[IU]/mL (ref <5)

## 2019-11-26 LAB — TROPONIN I (HIGH SENSITIVITY): Troponin I (High Sensitivity): <2 ng/L (ref <18)

## 2019-11-26 MED ORDER — SODIUM CHLORIDE 0.9% FLUSH
3.0000 mL | Freq: Once | INTRAVENOUS | Status: AC
  Administered 2019-11-27: 3 mL via INTRAVENOUS

## 2019-11-26 NOTE — ED Triage Notes (Signed)
Pt arrived POV ambulatory into ED CC Chest pain X 10 days. Pt reports "feel like something is sitting on my chest it burned right in the middle. When I took Mylanta it would stop then it came back today".  Pt denies daily medications only oral BC.   Pt tested positive COVID 12/28 has not had a negative test.

## 2019-11-27 ENCOUNTER — Emergency Department (HOSPITAL_COMMUNITY): Payer: No Typology Code available for payment source

## 2019-11-27 LAB — TROPONIN I (HIGH SENSITIVITY): Troponin I (High Sensitivity): <2 ng/L (ref <18)

## 2019-11-27 NOTE — ED Provider Notes (Signed)
[facility]-EMERGENCY DEPT Provider Note  CSN: [id number] Arrival date & time: 11/26/19 1739  Chief Complaint(s) No chief complaint on file.  HPI Audrey Phillips is a 44 y.o. female   The history is provided by the patient.  Chest Pain Pain location:  Substernal area Pain quality: pressure   Pain severity:  Moderate Duration: 1st episode was 2 wks ago and lasted 2 days; improved with baking soda.  2nd episode was 4 days ago and lasted 2 days; improve mylanta. 3rd episode was today, lasting all day; improved but not resolved with mylanta. Timing:  Intermittent Progression:  Resolved Chronicity:  Recurrent Relieved by:  Antacids Worsened by:  Certain positions Associated symptoms: no cough, no fever, no headache, no nausea, no shortness of breath and no vomiting   Risk factors: obesity   Risk factors: no diabetes mellitus, no high cholesterol, no hypertension, no prior DVT/PE and no smoking     Past Medical History No past medical history on file. There are no problems to display for this patient.  Home Medication(s) Prior to Admission medications   Medication Sig Start Date End Date Taking? Authorizing Provider  ibuprofen (ADVIL,MOTRIN) 800 MG tablet Take 1 tablet (800 mg total) by mouth every 8 (eight) hours as needed for mild pain. 02/11/19   Ward, Layla Maw, DO  Multiple Vitamin (MULTIVITAMIN) tablet Take 1 tablet by mouth daily.    [provider]  ondansetron (ZOFRAN ODT) 4 MG disintegrating tablet Take 1 tablet (4 mg total) by mouth every 6 (six) hours as needed. 02/11/19   Ward, Layla Maw, DO  oxyCODONE-acetaminophen (PERCOCET/ROXICET) 5-325 MG tablet Take 2 tablets by mouth every 6 (six) hours as needed. 02/11/19   Ward, Layla Maw, DO                                                                                                                                    Past Surgical History ** The histories are not reviewed yet. Please review them  in the "History" navigator section and refresh this SmartLink. Family History No family history on file.  Social History Social History   Tobacco Use  . Smoking status: Not on file  Substance Use Topics  . Alcohol use: Not on file  . Drug use: Not on file   Allergies Patient has no known allergies.  Review of Systems Review of Systems  Constitutional: Negative for fever.  Respiratory: Negative for cough and shortness of breath.   Cardiovascular: Positive for chest pain.  Gastrointestinal: Negative for nausea and vomiting.  Neurological: Negative for headaches.   All other systems are reviewed and are negative for acute change except as noted in the HPI  Physical Exam Vital Signs  I have reviewed the triage vital signs BP 128/94   Pulse 62   Temp 98 F (36.7 C) (Oral)   Resp (!) 22   LMP 11/22/2019   SpO2  100%   Physical Exam Vitals reviewed.  Constitutional:      General: She is not in acute distress.    Appearance: She is well-developed. She is not diaphoretic.  HENT:     Head: Normocephalic and atraumatic.     Nose: Nose normal.  Eyes:     General: No scleral icterus.       Right eye: No discharge.        Left eye: No discharge.     Conjunctiva/sclera: Conjunctivae normal.     Pupils: Pupils are equal, round, and reactive to light.  Cardiovascular:     Rate and Rhythm: Normal rate and regular rhythm.     Heart sounds: No murmur. No friction rub. No gallop.   Pulmonary:     Effort: Pulmonary effort is normal. No respiratory distress.     Breath sounds: Normal breath sounds. No stridor. No rales.  Abdominal:     General: There is no distension.     Palpations: Abdomen is soft.     Tenderness: There is no abdominal tenderness.  Musculoskeletal:        General: No tenderness.     Cervical back: Normal range of motion and neck supple.  Skin:    General: Skin is warm and dry.     Findings: No erythema or rash.  Neurological:     Mental Status: She is  alert and oriented to person, place, and time.     ED Results and Treatments Labs (all labs ordered are listed, but only abnormal results are displayed) Labs Reviewed  CBC - Abnormal; Notable for the following components:      Result Value   WBC 13.2 (*)    RBC 5.25 (*)    All other components within normal limits  BASIC METABOLIC PANEL  I-STAT BETA HCG BLOOD, ED ([facility], [facility], [facility] ONLY)  TROPONIN I (HIGH SENSITIVITY)  TROPONIN I (HIGH SENSITIVITY)                                                                                                                         EKG  EKG Interpretation  Date/Time:  Wednesday November 26 2019 17:52:57 EST Ventricular Rate:  71 PR Interval:    QRS Duration: 95 QT Interval:  396 QTC Calculation: 431 R Axis:   82 Text Interpretation: Sinus rhythm Nonspecific T wave abnormality NO STEMI. No old tracing to compare Confirmed by Addison Lank [phone]) on 11/27/2019 12:01:24 AM      Radiology CXR 2 View - Chest pain  Result Date: 11/27/2019 CLINICAL DATA:  Mid chest pain and tightness EXAM: CHEST - 2 VIEW COMPARISON:  None. FINDINGS: The heart size and mediastinal contours are within normal limits. Both lungs are clear. The visualized skeletal structures are unremarkable. IMPRESSION: No active cardiopulmonary disease. Electronically Signed   By: Prudencio Pair M.D.   On: 11/27/2019 00:44    Pertinent labs & imaging results that were available during my care of the patient  were reviewed by me and considered in my medical decision making (see chart for details).  Medications Ordered in ED Medications  sodium chloride flush (NS) 0.9 % injection 3 mL (3 mLs Intravenous Given 11/27/19 0059)                                                                                                                                    Procedures Procedures  (including critical care time)  Medical Decision Making / ED Course I have reviewed the nursing notes for this  encounter and the patient's prior records (if available in EHR or on provided paperwork).   Audrey Phillips was evaluated in Emergency Department on 11/27/2019 for the symptoms described in the history of present illness. She was evaluated in the context of the global COVID-19 pandemic, which necessitated consideration that the patient might be at risk for infection with the SARS-CoV-2 virus that causes COVID-19. Institutional protocols and algorithms that pertain to the evaluation of patients at risk for COVID-19 are in a state of rapid change based on information released by regulatory bodies including the CDC and federal and state organizations. These policies and algorithms were followed during the patient's care in the ED.  Atypical chest pain. Most suspicious for GERD. Currently asymptomatic. EKG with nonspecific TW changes, no prior. Trop > 10 hrs after pain onset is negative. Feel this is sufficient to rule out ACS in this patient with HEART score <3.  Low pretest for PE. Presentation not classic for dissection.  Chest x-ray without evidence suggestive of pneumonia, pneumothorax, pneumomediastinum.  No abnormal contour of the mediastinum to suggest dissection. No evidence of acute injuries.  The patient appears reasonably screened and/or stabilized for discharge and I doubt any other medical condition or other [facility] requiring further screening, evaluation, or treatment in the ED at this time prior to discharge.  The patient is safe for discharge with strict return precautions.        Final Clinical Impression(s) / ED Diagnoses Final diagnoses:  Chest pain     The patient appears reasonably screened and/or stabilized for discharge and I doubt any other medical condition or other [facility] requiring further screening, evaluation, or treatment in the ED at this time prior to discharge.  Disposition: Discharge  Condition: Good  I have discussed the results, Dx and Tx plan with the  patient who expressed understanding and agree(s) with the plan. Discharge instructions discussed at great length. The patient was given strict return precautions who verbalized understanding of the instructions. No further questions at time of discharge.    ED Discharge Orders    None       Follow Up: [facility] [address] [place] [address] [phone]  Schedule an appointment as soon as possible for a visit  As needed     This chart was dictated using voice recognition software.  Despite  best efforts to proofread,  errors can occur which can change the documentation meaning.   Nira Conn, MD 11/27/19 [phone]

## 2019-11-27 NOTE — ED Notes (Signed)
Pt discharged during downtime. Unable to sign.

## 2020-01-08 ENCOUNTER — Encounter: Payer: Self-pay | Admitting: Neurology

## 2020-02-05 ENCOUNTER — Ambulatory Visit (INDEPENDENT_AMBULATORY_CARE_PROVIDER_SITE_OTHER): Payer: 59 | Admitting: Nurse Practitioner

## 2020-02-05 ENCOUNTER — Other Ambulatory Visit: Payer: Self-pay

## 2020-02-05 VITALS — BP 122/78 | HR 58 | Temp 97.1°F | Ht 61.0 in | Wt 228.5 lb

## 2020-02-05 DIAGNOSIS — Z8616 Personal history of COVID-19: Secondary | ICD-10-CM

## 2020-02-05 DIAGNOSIS — M792 Neuralgia and neuritis, unspecified: Secondary | ICD-10-CM

## 2020-02-05 MED ORDER — PREDNISONE 20 MG PO TABS: 20.0000 mg | ORAL_TABLET | Freq: Every day | ORAL | 0 refills | Status: AC

## 2020-02-05 NOTE — Assessment & Plan Note (Signed)
Nerve Pain: Patient has been having ongoing nerve pain down both legs since diagnosed with covid. She states that it is worse at night and she has difficulty sleeping due to this. Her doctor has prescribed Neurontin for this. She states that this has helped and she is sleeping better at night. She describes the pain as burning and radiating down both legs on inner and outer thighs. She states that this radiates down into her toes. At times it feels like a crawling sensation at night. I suspect some sciatic nerve involvement and possibly restless leg. She has an appointment scheduled with neurology tomorrow.  Plan:  Keep appointment with Neurology Will order prednisone

## 2020-02-05 NOTE — Progress Notes (Signed)
@Patient  ID: Audrey Phillips, female    DOB: Mar 26, 1976, 44 y.o.   MRN: [id number]  Chief Complaint  Patient presents with  . Post COVID    Tested postive 12/28. Sx: Nerve pain in legs and feet.     Referring provider: [facility]   44 year old female with no significant health history who was diagnosed with covid on December 28th.     HPI   Patient presents for post covid care visit. She states that she has been having ongoing nerve pain down both legs since diagnosed with covid. She states that it is worse at night and she has difficulty sleeping due to this. Her doctor has prescribed Neurontin for this. She states that this has helped and she is sleeping better at night. She describes the pain as burning and radiating down both legs on inner and outer thighs. She states that this radiates down into her toes. At times it feels like a crawling sensation at night. She is active and works out regularly. She already has an appointment with neurology scheduled for tomorrow. Her PCP recently checked CBC and CMP - labs were normal. Recent chets x ray was clear. Denies f/c/s, n/v/d, hemoptysis, PND, denies chest pain or edema.      No Known Allergies   There is no immunization history on file for this patient.  History reviewed. No pertinent past medical history.  Tobacco History: Social History   Tobacco Use  Smoking Status Never Smoker  Smokeless Tobacco Never Used   Counseling given: Yes   Outpatient Encounter Medications as of 02/05/2020  Medication Sig  . gabapentin (NEURONTIN) 100 MG capsule Take 100 mg by mouth at bedtime.  . cetirizine (ZYRTEC) 10 MG tablet Take 10 mg by mouth daily.  . fluticasone (FLONASE) 50 MCG/ACT nasal spray Place 1 spray into both nostrils daily.  Marland Kitchen ibuprofen (ADVIL,MOTRIN) 800 MG tablet Take 1 tablet (800 mg total) by mouth every 8 (eight) hours as needed for mild pain. (Patient not taking: Reported on 02/05/2020)  . Multiple  Vitamin (MULTIVITAMIN) tablet Take 1 tablet by mouth daily.  . norethindrone-ethinyl estradiol (LOESTRIN) 1-20 MG-MCG tablet Take by mouth.  . ondansetron (ZOFRAN ODT) 4 MG disintegrating tablet Take 1 tablet (4 mg total) by mouth every 6 (six) hours as needed. (Patient not taking: Reported on 02/05/2020)  . oxyCODONE-acetaminophen (PERCOCET/ROXICET) 5-325 MG tablet Take 2 tablets by mouth every 6 (six) hours as needed. (Patient not taking: Reported on 02/05/2020)  . predniSONE (DELTASONE) 20 MG tablet Take 1 tablet (20 mg total) by mouth daily with breakfast for 5 days.   No facility-administered encounter medications on file as of 02/05/2020.     Review of Systems  Review of Systems  Constitutional: Positive for fatigue. Negative for activity change, appetite change and fever.  HENT: Negative.   Respiratory: Negative for cough and shortness of breath.   Cardiovascular: Negative.   Gastrointestinal: Negative.   Musculoskeletal: Positive for myalgias.  Allergic/Immunologic: Negative.   Neurological: Negative.   Psychiatric/Behavioral: Negative.        Physical Exam  BP 122/78 (BP Location: Right Arm, Patient Position: Sitting, Cuff Size: Large)   Pulse (!) 58   Temp (!) 97.1 F (36.2 C)   Ht 5\' 1"  (1.549 m)   Wt 228 lb 8 oz (103.6 kg)   LMP 01/26/2020   SpO2 98%   BMI 43.17 kg/m   Wt Readings from Last 5 Encounters:  02/05/20 228 lb 8  oz (103.6 kg)  02/11/19 237 lb 11.2 oz (107.8 kg)  04/10/12 235 lb 14.4 oz (107 kg)     Physical Exam Vitals and nursing note reviewed.  Constitutional:      General: She is not in acute distress.    Appearance: She is well-developed.  Cardiovascular:     Rate and Rhythm: Normal rate and regular rhythm.  Pulmonary:     Effort: Pulmonary effort is normal.     Breath sounds: Normal breath sounds.  Neurological:     Mental Status: She is alert and oriented to person, place, and time.     Gait: Gait normal.     Comments: Grips equal  to bilat extremities.         Assessment & Plan:   History of COVID-19 Nerve Pain: Patient has been having ongoing nerve pain down both legs since diagnosed with covid. She states that it is worse at night and she has difficulty sleeping due to this. Her doctor has prescribed Neurontin for this. She states that this has helped and she is sleeping better at night. She describes the pain as burning and radiating down both legs on inner and outer thighs. She states that this radiates down into her toes. At times it feels like a crawling sensation at night. I suspect some sciatic nerve involvement and possibly restless leg. She has an appointment scheduled with neurology tomorrow.  Plan:  Keep appointment with Neurology Will order prednisone    Patient Instructions      Audrey Andrew, NP 02/05/2020

## 2020-02-05 NOTE — Patient Instructions (Signed)
Keep appointment with Neurology Will order prednisone  Managing pain      If told, put ice on the affected area. ? Put ice in a plastic bag. ? Place a towel between your skin and the bag. ? Leave the ice on for 20 minutes, 2-3 times a day.  If told, put heat on the affected area. Use the heat source that your doctor tells you to use, such as a moist heat pack or a heating pad. ? Place a towel between your skin and the heat source. ? Leave the heat on for 20-30 minutes. ? Remove the heat if your skin turns bright red. This is very important if you are unable to feel pain, heat, or cold. You may have a greater risk of getting burned. Activity   Return to your normal activities as told by your doctor. Ask your doctor what activities are safe for you.  Avoid activities that make your symptoms worse.  Take short rests during the day. ? When you rest for a long time, do some physical activity or stretching between periods of rest. ? Avoid sitting for a long time without moving. Get up and move around at least one time each hour.  Exercise and stretch regularly, as told by your doctor.  Do not lift anything that is heavier than 10 lb (4.5 kg) while you have symptoms of sciatica. ? Avoid lifting heavy things even when you do not have symptoms. ? Avoid lifting heavy things over and over.  When you lift objects, always lift in a way that is safe for your body. To do this, you should: ? Bend your knees. ? Keep the object close to your body. ? Avoid twisting. General instructions  Stay at a healthy weight.  Wear comfortable shoes that support your feet. Avoid wearing high heels.  Avoid sleeping on a mattress that is too soft or too hard. You might have less pain if you sleep on a mattress that is firm enough to support your back.  Keep all follow-up visits as told by your doctor. This is important. Contact a doctor if:  You have pain that: ? Wakes you up when you are  sleeping. ? Gets worse when you lie down. ? Is worse than the pain you have had in the past. ? Lasts longer than 4 weeks.  You lose weight without trying. Get help right away if:  You cannot control when you pee (urinate) or poop (have a bowel movement).  You have weakness in any of these areas and it gets worse: ? Lower back. ? The area between your hip bones. ? Butt. ? Legs.  You have redness or swelling of your back.  You have a burning feeling when you pee. Summary  Sciatica is pain, weakness, tingling, or loss of feeling (numbness) along the sciatic nerve.  This condition happens when the sciatic nerve is pinched or has pressure put on it.  Sciatica can cause pain, tingling, or loss of feeling (numbness) in the lower back, legs, hips, and butt.  Treatment often includes rest, exercise, medicines, and putting ice or heat on the affected area.

## 2020-02-06 ENCOUNTER — Other Ambulatory Visit (INDEPENDENT_AMBULATORY_CARE_PROVIDER_SITE_OTHER): Payer: 59

## 2020-02-06 ENCOUNTER — Encounter: Payer: Self-pay | Admitting: Neurology

## 2020-02-06 ENCOUNTER — Ambulatory Visit (INDEPENDENT_AMBULATORY_CARE_PROVIDER_SITE_OTHER): Payer: 59 | Admitting: Neurology

## 2020-02-06 ENCOUNTER — Other Ambulatory Visit: Payer: Self-pay

## 2020-02-06 ENCOUNTER — Ambulatory Visit: Payer: 59

## 2020-02-06 VITALS — BP 110/50 | HR 72 | Ht 61.0 in | Wt 229.0 lb

## 2020-02-06 DIAGNOSIS — E639 Nutritional deficiency, unspecified: Secondary | ICD-10-CM

## 2020-02-06 DIAGNOSIS — R202 Paresthesia of skin: Secondary | ICD-10-CM

## 2020-02-06 NOTE — Patient Instructions (Addendum)
Check labs Continue gabapentin 100mg  at bedtime Your provider has requested that you have labwork completed today. Please go to [facility] ([id number]) on the second floor of this building before leaving the office today. You do not need to check in. If you are not called within 15 minutes please check with the front desk.

## 2020-02-06 NOTE — Progress Notes (Signed)
[facility] Clinic Note - Initial Visit   Date: 02/06/20  GENASIS ZINGALE MRN: [id number] DOB: 1976/01/17   Dear Serina Cowper, PA-C:  Thank you for your kind referral of Claudine Mouton for consultation of paresthesias. Although her history is well known to you, please allow [place] to reiterate it for the purpose of our medical record. The patient was accompanied to the clinic by self.    History of Present Illness: Audrey Phillips is a 44 y.o. left-handed female with COVID19 infection (10/2019) and GERD presenting for evaluation of numbness/tingling.  She had COVID19 in late December and starting in January 2020, she began having diffuse numbness/tingling involving the arms and legs.  Symptoms have improved in her arms.  She has occasional tingling in the fingers and continues to have numbness/tingling in the thighs.  Symptoms are intermittent and worse with prolonged sitting and improve with activity.  She does not have weakness, imbalance, or falls.  She gabapentin 100mg  at bedtime, which helps with sleep and she is no longer waking up with severe pain at night.    Post-COVID clinic yesterday, she was given prednisone 20mg  for 5 days.  Her fatigue has significantly improved.  She does not drink alcohol and has no history of diabetes.  She lives alone with her 64 year old son and works as Geologist, engineering.   Out-side paper records, electronic medical record, and images have been reviewed where available and summarized as:  CBC, CMP, vitamin D 12/29/2019:  Normal  Past Medical History:  Diagnosis Date  . COVID-19     Past Surgical History:  Procedure Laterality Date  . CESAREAN SECTION  08/2018  . leap surgery       Medications:  Outpatient Encounter Medications as of 02/06/2020  Medication Sig  . cetirizine (ZYRTEC) 10 MG tablet Take 10 mg by mouth daily.  . fluticasone (FLONASE) 50 MCG/ACT nasal spray Place 1 spray into both nostrils  daily.  Marland Kitchen gabapentin (NEURONTIN) 100 MG capsule Take 100 mg by mouth at bedtime.  . Multiple Vitamin (MULTIVITAMIN) tablet Take 1 tablet by mouth daily.  . Vitamin D, Ergocalciferol, (DRISDOL) 1.25 MG (50000 UNIT) CAPS capsule Take 50,000 Units by mouth every morning.  Marland Kitchen ibuprofen (ADVIL,MOTRIN) 800 MG tablet Take 1 tablet (800 mg total) by mouth every 8 (eight) hours as needed for mild pain. (Patient not taking: Reported on 02/06/2020)  . norethindrone-ethinyl estradiol (LOESTRIN) 1-20 MG-MCG tablet Take by mouth.  . ondansetron (ZOFRAN ODT) 4 MG disintegrating tablet Take 1 tablet (4 mg total) by mouth every 6 (six) hours as needed. (Patient not taking: Reported on 02/05/2020)  . oxyCODONE-acetaminophen (PERCOCET/ROXICET) 5-325 MG tablet Take 2 tablets by mouth every 6 (six) hours as needed. (Patient not taking: Reported on 02/05/2020)  . predniSONE (DELTASONE) 20 MG tablet Take 1 tablet (20 mg total) by mouth daily with breakfast for 5 days. (Patient not taking: Reported on 02/06/2020)   No facility-administered encounter medications on file as of 02/06/2020.    Allergies: No Known Allergies  Family History: Family History  Problem Relation Age of Onset  . Cirrhosis Mother   . Alcohol abuse Father   . Heart disease Father   . Cancer Maternal Grandmother   . Cancer Maternal Grandfather   . Heart disease Paternal Grandmother   . Cancer Paternal Grandfather     Social History: Social History   Tobacco Use  . Smoking status: Never Smoker  . Smokeless tobacco: Never Used  Substance Use  Topics  . Alcohol use: Never  . Drug use: Never   Social History   Social History Narrative   Left handed   One story home   Drinks no caffeine     Vital Signs:  BP (!) 110/50   Pulse 72   Ht 5\' 1"  (1.549 m)   Wt 229 lb (103.9 kg)   LMP 01/26/2020   SpO2 100%   BMI 43.27 kg/m   Neurological Exam: MENTAL STATUS including orientation to time, place, person, recent and remote memory,  attention span and concentration, language, and fund of knowledge is normal.  Speech is not dysarthric.  CRANIAL NERVES: II:  No visual field defects.   III-IV-VI: Pupils equal round and reactive to light.  Normal conjugate, extra-ocular eye movements in all directions of gaze.  No nystagmus.  No ptosis.   V:  Normal facial sensation.    VII:  Normal facial symmetry and movements.   VIII:  Normal hearing and vestibular function.   IX-X:  Normal palatal movement.   XI:  Normal shoulder shrug and head rotation.   XII:  Normal tongue strength and range of motion, no deviation or fasciculation.  MOTOR:  No atrophy, fasciculations or abnormal movements.  No pronator drift.   Upper Extremity:  Right  Left  Deltoid  5/5   5/5   Biceps  5/5   5/5   Triceps  5/5   5/5   Infraspinatus 5/5  5/5  Medial pectoralis 5/5  5/5  Wrist extensors  5/5   5/5   Wrist flexors  5/5   5/5   Finger extensors  5/5   5/5   Finger flexors  5/5   5/5   Dorsal interossei  5/5   5/5   Abductor pollicis  5/5   5/5   Tone (Ashworth scale)  0  0   Lower Extremity:  Right  Left  Hip flexors  5/5   5/5   Hip extensors  5/5   5/5   Adductor 5/5  5/5  Abductor 5/5  5/5  Knee flexors  5/5   5/5   Knee extensors  5/5   5/5   Dorsiflexors  5/5   5/5   Plantarflexors  5/5   5/5   Toe extensors  5/5   5/5   Toe flexors  5/5   5/5   Tone (Ashworth scale)  0  0   MSRs:  Right        Left                  brachioradialis 2+  2+  biceps 2+  2+  triceps 2+  2+  patellar 2+  2+  ankle jerk 2+  2+  Hoffman no  no  plantar response down  down   SENSORY:  Normal and symmetric perception of light touch, pinprick, vibration, and proprioception.  Romberg's sign absent.   COORDINATION/GAIT: Normal finger-to- nose-finger and heel-to-shin.  Intact rapid alternating movements bilaterally.   Gait narrow based and stable. Tandem and stressed gait intact.    IMPRESSION: Nonspecific paresthesias, improving. Patient  reassured that with normal exam and improving symptoms, the likelihood of anything worrisome is very low.   To be complete, I will check TSH and vitamin B12.   Encouraged her to stay active OK to continue gabapentin 100mg  at bedtime.  We discussed that she can discontinue this once symptoms resolve.    Thank you for allowing me to participate in  patient's care.  If I can answer any additional questions, I would be pleased to do so.    Sincerely,    Indy Kuck K. Posey Pronto, DO

## 2020-02-07 LAB — VITAMIN B12: Vitamin B-12: 976 pg/mL (ref 200–1100)

## 2020-02-07 LAB — TSH: TSH: 0.96 mIU/L

## 2020-09-01 IMAGING — CR DG CHEST 2V
2 series · 2 of 2 positions shown · non-contrast
Comparison: None.

CLINICAL DATA: Mid chest pain and tightness

EXAM:
CHEST - 2 VIEW

[w chest pa]
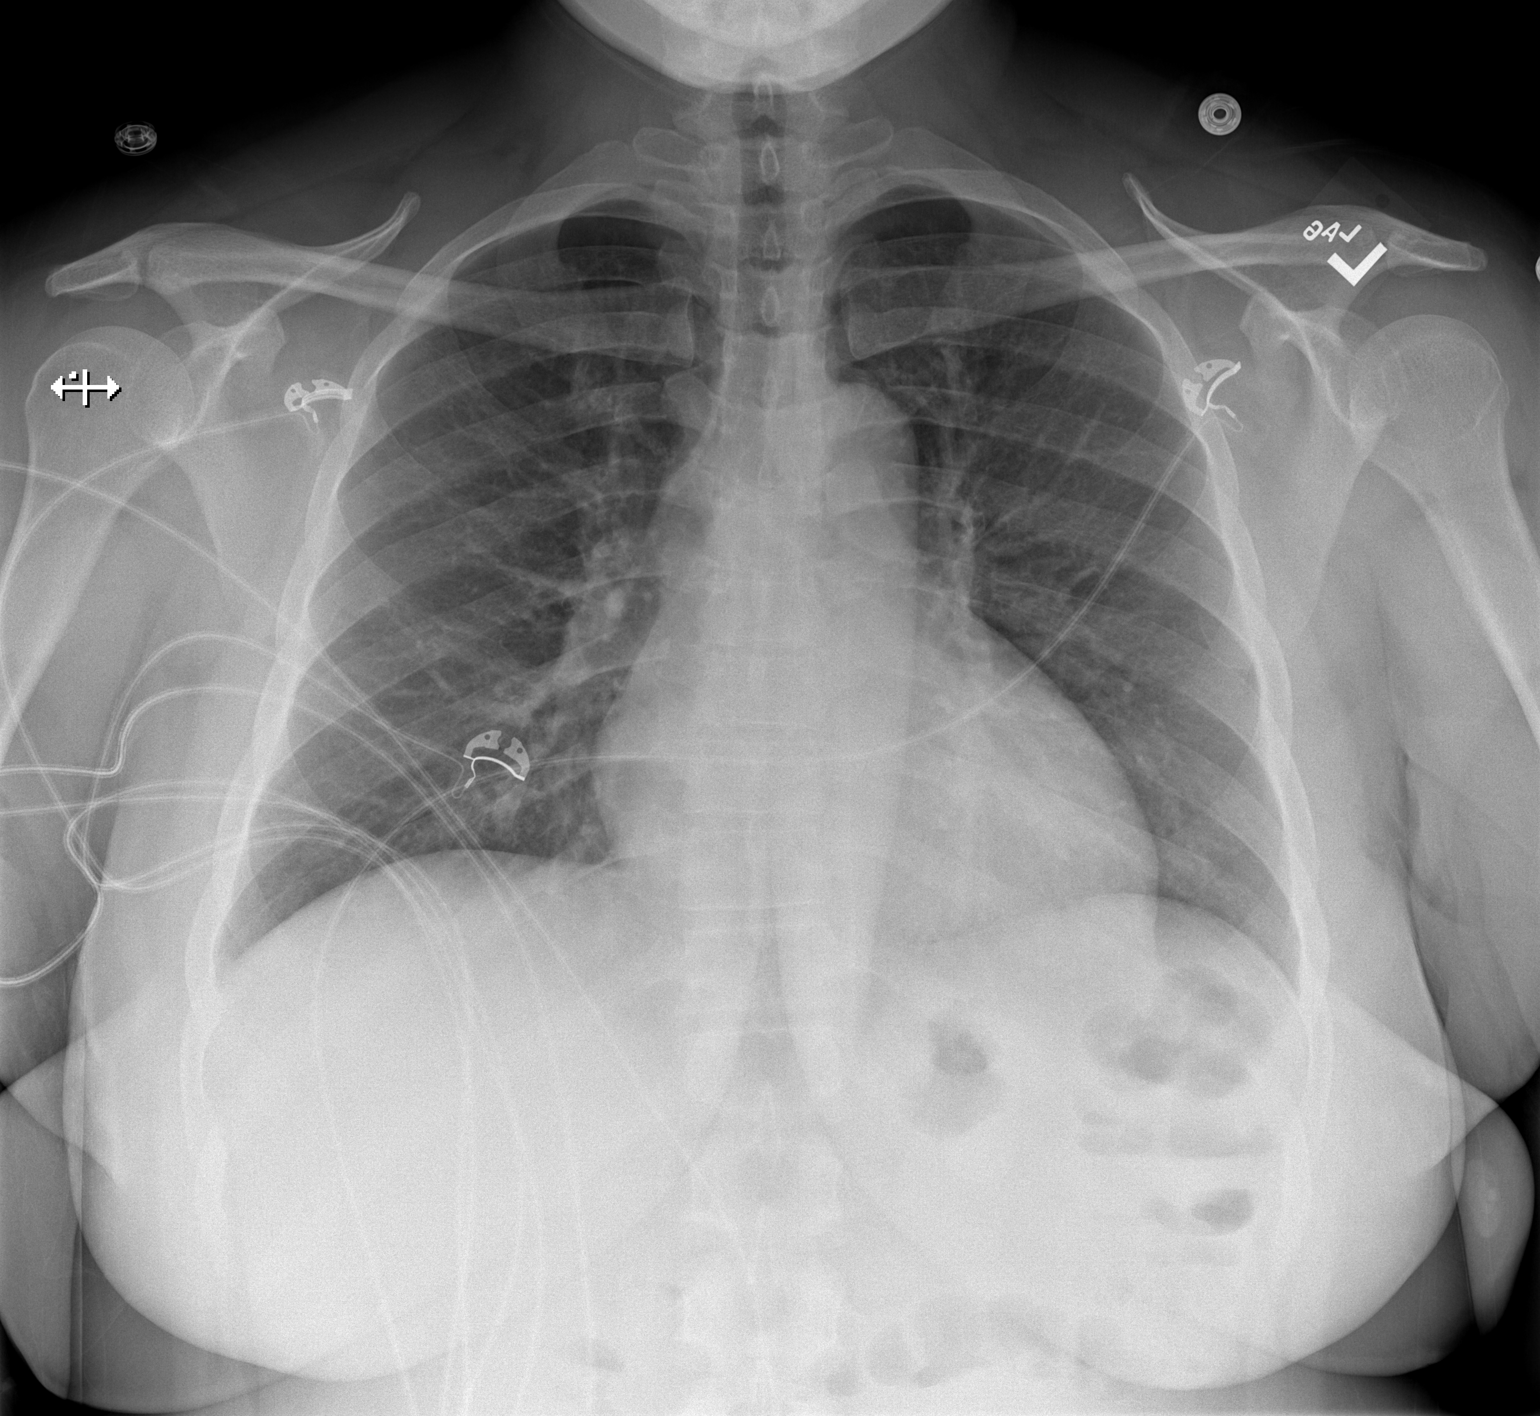

[w chest lat]
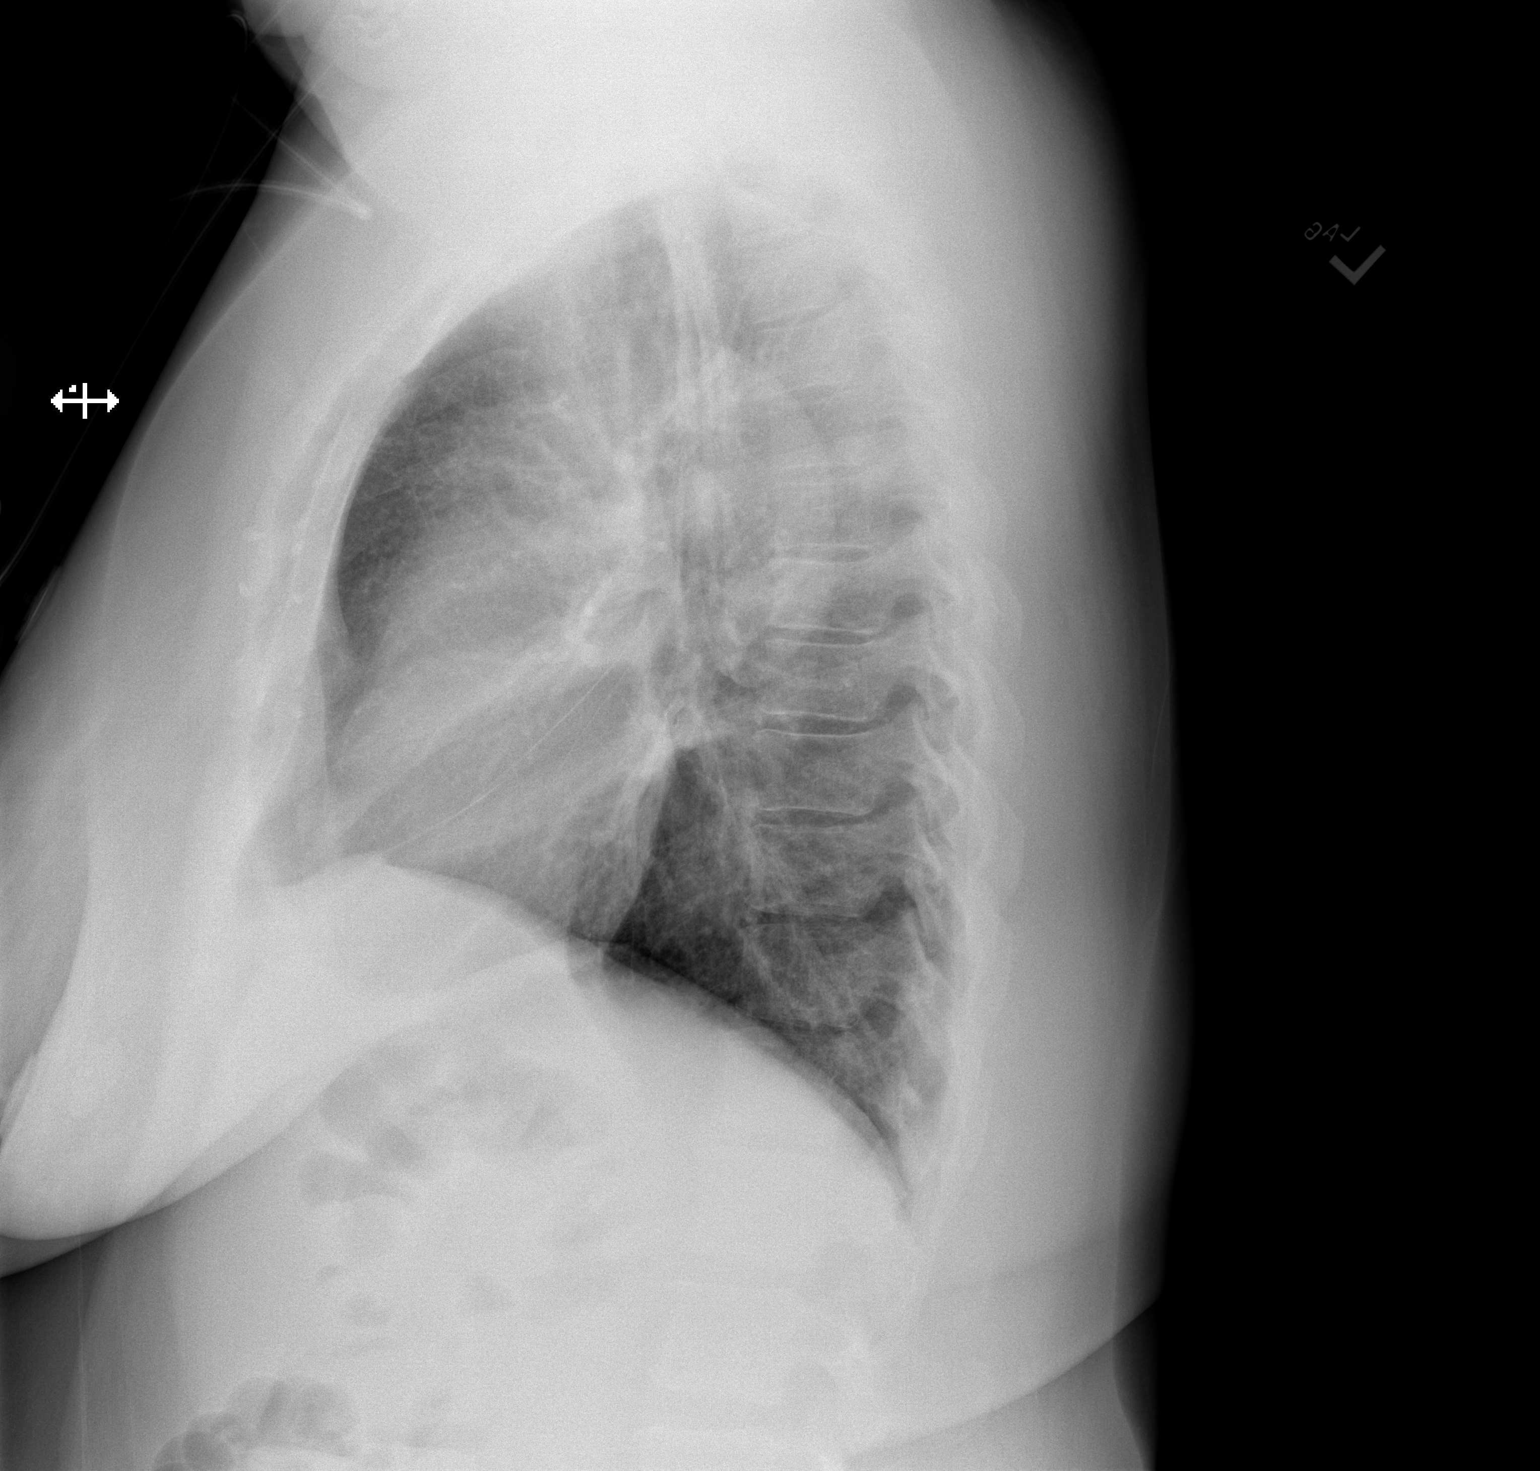

[2 of 2 positions shown; findings below may reference images not displayed]

FINDINGS: The heart size and mediastinal contours are within normal limits.
Both lungs are clear. The visualized skeletal structures are
unremarkable.
IMPRESSION: No active cardiopulmonary disease.

## 2024-09-17 ENCOUNTER — Other Ambulatory Visit: Payer: Self-pay

## 2024-09-19 LAB — SURGICAL PATHOLOGY
